# Patient Record
Sex: Female | Born: 1995 | Race: White | Hispanic: No | Marital: Single | Smoking: Never smoker
Health system: Southern US, Community
[De-identification: ages and names within clinical notes are randomized; demographics above are authoritative.]

## PROBLEM LIST (undated history)

## (undated) HISTORY — PX: TYMPANOSTOMY TUBE PLACEMENT: SHX32

---
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
## 2017-08-16 ENCOUNTER — Emergency Department
Admission: EM | Admit: 2017-08-16 | Discharge: 2017-08-16 | Disposition: A | Discharge status: Home / Self Care | Payer: Self-pay | Attending: Emergency Medicine | Admitting: Emergency Medicine

## 2017-08-16 ENCOUNTER — Other Ambulatory Visit: Payer: Self-pay

## 2017-08-16 ENCOUNTER — Encounter: Payer: Self-pay | Admitting: Emergency Medicine

## 2017-08-16 DIAGNOSIS — J069 Acute upper respiratory infection, unspecified: Secondary | ICD-10-CM | POA: Insufficient documentation

## 2017-08-16 DIAGNOSIS — B9789 Other viral agents as the cause of diseases classified elsewhere: Secondary | ICD-10-CM

## 2017-08-16 MED ORDER — GUAIFENESIN-CODEINE 100-10 MG/5ML PO SYRP: 5.0000 mL | ORAL_SOLUTION | Freq: Three times a day (TID) | ORAL | 0 refills | Status: AC | PRN

## 2017-08-16 NOTE — ED Notes (Signed)
Coughing during triage.  Color good, skin warm and dry.  Wearing face mask.

## 2017-08-16 NOTE — ED Triage Notes (Signed)
Patient complaining of cough beginning yesterday noon.  Emesis during the night.  Chest pain with coughing, occasionally productive.  Also complaining of sore throat "down the back".  Wearing face mask.  Accompanied by Mom.

## 2017-08-16 NOTE — ED Notes (Signed)
See triage note  Presents with a 1 day hx of cough..states she is also having some chest discomfort cough  Vomited last pm  Denies any fever  Afebrile on arrival

## 2017-08-17 NOTE — ED Provider Notes (Signed)
[facility] Emergency Department Provider Note  ____________________________________________  Time seen: Approximately 09:16 AM  I have reviewed the triage vital signs and the nursing notes.   HISTORY  Chief Complaint Cough and Emesis   HPI Olivia Bonilla is a 22 y.o. female who presents to the emergency department for treatment and evaluation of URI symptoms that started yesterday. Cough is occasionally productive and causes her chest to hurt. She had a single episode of posttussive emesis last night.  No known fever.  No alleviating measures were attempted for this complaint prior to arrival.  History reviewed. No pertinent past medical history.  There are no active problems to display for this patient.   Prior to Admission medications   Medication Sig Start Date End Date Taking? Authorizing Provider  guaiFENesin-codeine (ROBITUSSIN AC) 100-10 MG/5ML syrup Take 5 mLs by mouth 3 (three) times daily as needed for cough. 08/16/17   Chinita Pesterriplett, Alonzo Loving B, FNP    Allergies Patient has no known allergies.  No family history on file.  Social History Social History   Tobacco Use  . Smoking status: Never Smoker  . Smokeless tobacco: Never Used  Substance Use Topics  . Alcohol use: Never    Frequency: Never  . Drug use: Never    Review of Systems Constitutional: Negative fever/chills ENT: Positive sore throat. Cardiovascular: Denies chest pain. Respiratory: Negative for shortness of breath.  Positive for cough. Gastrointestinal: Positive nausea, single episode of vomiting.  No diarrhea.  Musculoskeletal: Negative for body aches Skin: Negative for rash. Neurological: Positive for headaches ____________________________________________   PHYSICAL EXAM:  VITAL SIGNS: ED Triage Vitals  Enc Vitals Group     BP 08/16/17 0816 (!) 150/70     Pulse Rate 08/16/17 0816 88     Resp 08/16/17 0816 16     Temp 08/16/17 0816 98.3 F (36.8 C)     Temp Source  08/16/17 0816 Oral     SpO2 08/16/17 0816 98 %     Weight 08/16/17 0818 195 lb (88.5 kg)     Height 08/16/17 0818 5\' 7"  (1.702 m)     Head Circumference --      Peak Flow --      Pain Score 08/16/17 0817 6     Pain Loc --      Pain Edu? --      Excl. in GC? --     Constitutional: Alert and oriented.  Acutely ill appearing and in no acute distress. Eyes: Conjunctivae are normal. EOMI. Ears: Bilateral tympanic membranes are mildly injected and erythematous Nose: Sinus congestion noted; clear rhinnorhea. Mouth/Throat: Mucous membranes are moist.  Oropharynx mildly erythematous. Tonsils not visualized. Neck: No stridor.  Lymphatic: No cervical lymphadenopathy. Cardiovascular: Normal rate, regular rhythm. Good peripheral circulation. Respiratory: Normal respiratory effort.  No retractions.  Breath sounds clear to auscultation throughout. Gastrointestinal: Soft and nontender.  Musculoskeletal: FROM x 4 extremities.  Neurologic:  Normal speech and language.  Skin:  Skin is warm, dry and intact. No rash noted. Psychiatric: Mood and affect are normal. Speech and behavior are normal.  ____________________________________________   LABS (all labs ordered are listed, but only abnormal results are displayed)  Labs Reviewed - No data to display ____________________________________________  EKG  Not indicated ____________________________________________  RADIOLOGY  None indicated ____________________________________________   PROCEDURES  Procedure(s) performed: None  Critical Care performed: No ____________________________________________   INITIAL IMPRESSION / ASSESSMENT AND PLAN / ED COURSE  22 y.o. female who presents to the emergency department  for treatment and evaluation of symptoms and exam most consistent with viral upper respiratory infection.  She will be given a prescription for guaifenesin with codeine and advised to follow-up with the primary care provider for  choice for symptoms that are not improving over the next few days.  She was encouraged to take Tylenol or ibuprofen if needed for pain or fever. She was encouraged to return to the ER for symptoms that change or worsen if unable to schedule an appointment.  Medications - No data to display  ED Discharge Orders        Ordered    guaiFENesin-codeine (ROBITUSSIN AC) 100-10 MG/5ML syrup  3 times daily PRN     08/16/17 0859       Pertinent labs & imaging results that were available during my care of the patient were reviewed by me and considered in my medical decision making (see chart for details).    If controlled substance prescribed during this visit, 12 month history viewed on the [facility] prior to issuing an initial prescription for Schedule II or III opiod. ____________________________________________   FINAL CLINICAL IMPRESSION(S) / ED DIAGNOSES  Final diagnoses:  Viral URI with cough    Note:  This document was prepared using Dragon voice recognition software and may include unintentional dictation errors.     Chinita Pester, FNP 08/17/17 1642    Darci Current, MD 08/18/17 [phone]

## 2018-04-07 ENCOUNTER — Other Ambulatory Visit: Payer: Self-pay

## 2018-04-07 DIAGNOSIS — R0602 Shortness of breath: Secondary | ICD-10-CM | POA: Insufficient documentation

## 2018-04-07 DIAGNOSIS — R0789 Other chest pain: Secondary | ICD-10-CM | POA: Diagnosis not present

## 2018-04-07 DIAGNOSIS — R079 Chest pain, unspecified: Secondary | ICD-10-CM | POA: Diagnosis present

## 2018-04-07 NOTE — ED Triage Notes (Signed)
Pt states has had mid chest pain off and on for 4 days. Pt states at times also has right arm pain. Pt states has had shob, but no nausea. Pt appears in no acute distress.

## 2018-04-08 ENCOUNTER — Emergency Department: Payer: Medicaid - Out of State

## 2018-04-08 ENCOUNTER — Emergency Department
Admission: EM | Admit: 2018-04-08 | Discharge: 2018-04-08 | Disposition: A | Discharge status: Home / Self Care | Payer: Medicaid - Out of State | Attending: Emergency Medicine | Admitting: Emergency Medicine

## 2018-04-08 DIAGNOSIS — R0789 Other chest pain: Secondary | ICD-10-CM

## 2018-04-08 LAB — HEPATIC FUNCTION PANEL
ALT: 16 U/L (ref 0–44)
AST: 17 U/L (ref 15–41)
Albumin: 4.3 g/dL (ref 3.5–5.0)
Alkaline Phosphatase: 56 U/L (ref 38–126)
Bilirubin, Direct: <0.1 mg/dL (ref 0.0–0.2)
TOTAL PROTEIN: 7.5 g/dL (ref 6.5–8.1)
Total Bilirubin: 0.7 mg/dL (ref 0.3–1.2)

## 2018-04-08 LAB — CBC
HEMATOCRIT: 39.5 % (ref 36.0–46.0)
HEMOGLOBIN: 13 g/dL (ref 12.0–15.0)
MCH: 30.3 pg (ref 26.0–34.0)
MCHC: 32.9 g/dL (ref 30.0–36.0)
MCV: 92.1 fL (ref 80.0–100.0)
Platelets: 322 10*3/uL (ref 150–400)
RBC: 4.29 MIL/uL (ref 3.87–5.11)
RDW: 12 % (ref 11.5–15.5)
WBC: 10.2 10*3/uL (ref 4.0–10.5)
nRBC: 0 % (ref 0.0–0.2)

## 2018-04-08 LAB — BASIC METABOLIC PANEL
Anion gap: 11 (ref 5–15)
BUN: 19 mg/dL (ref 6–20)
CO2: 23 mmol/L (ref 22–32)
Calcium: 8.8 mg/dL — ABNORMAL LOW (ref 8.9–10.3)
Chloride: 104 mmol/L (ref 98–111)
Creatinine, Ser: 0.75 mg/dL (ref 0.44–1.00)
GFR calc Af Amer: >60 mL/min (ref >60)
GFR calc non Af Amer: >60 mL/min (ref >60)
Glucose, Bld: 91 mg/dL (ref 70–99)
POTASSIUM: 3.8 mmol/L (ref 3.5–5.1)
Sodium: 138 mmol/L (ref 135–145)

## 2018-04-08 LAB — TROPONIN I: Troponin I: <0.03 ng/mL (ref <0.03)

## 2018-04-08 LAB — POCT PREGNANCY, URINE: Preg Test, Ur: NEGATIVE

## 2018-04-08 LAB — FIBRIN DERIVATIVES D-DIMER (ARMC ONLY): Fibrin derivatives D-dimer (ARMC): 236.54 ng/mL (FEU) (ref 0.00–499.00)

## 2018-04-08 LAB — LIPASE, BLOOD: Lipase: 36 U/L (ref 11–51)

## 2018-04-08 MED ORDER — SUCRALFATE 1 G PO TABS
1.0000 g | ORAL_TABLET | Freq: Once | ORAL | Status: AC
  Administered 2018-04-08: 1 g via ORAL
  Filled 2018-04-08: qty 1

## 2018-04-08 MED ORDER — FAMOTIDINE 20 MG PO TABS: 20.0000 mg | ORAL_TABLET | Freq: Two times a day (BID) | ORAL | 0 refills | Status: AC

## 2018-04-08 MED ORDER — ALUM & MAG HYDROXIDE-SIMETH 200-200-20 MG/5ML PO SUSP
30.0000 mL | Freq: Once | ORAL | Status: AC
  Administered 2018-04-08: 30 mL via ORAL
  Filled 2018-04-08: qty 30

## 2018-04-08 MED ORDER — MISOPROSTOL 200 MCG PO TABS
200.0000 ug | ORAL_TABLET | Freq: Once | ORAL | Status: AC
  Administered 2018-04-08: 200 ug via ORAL
  Filled 2018-04-08: qty 1

## 2018-04-08 NOTE — Discharge Instructions (Signed)
Fortunately today your blood work was reassuring.  Please take your antacids as prescribed and follow up with a new PMD within 1 week for a recheck.  Return to the ED sooner for any concerns.  It was a pleasure to take care of you today, and thank you for coming to our emergency department.  If you have any questions or concerns before leaving please ask the nurse to grab me and I'm more than happy to go through your aftercare instructions again.  If you were prescribed any opioid pain medication today such as Norco, Vicodin, Percocet, morphine, hydrocodone, or oxycodone please make sure you do not drive when you are taking this medication as it can alter your ability to drive safely.  If you have any concerns once you are home that you are not improving or are in fact getting worse before you can make it to your follow-up appointment, please do not hesitate to call 911 and come back for further evaluation.  Merrily BrittleNeil Samina Weekes, MD  Results for orders placed or performed during the hospital encounter of 04/08/18  Basic metabolic panel  Result Value Ref Range   Sodium 138 135 - 145 mmol/L   Potassium 3.8 3.5 - 5.1 mmol/L   Chloride 104 98 - 111 mmol/L   CO2 23 22 - 32 mmol/L   Glucose, Bld 91 70 - 99 mg/dL   BUN 19 6 - 20 mg/dL   Creatinine, Ser 5.280.75 0.44 - 1.00 mg/dL   Calcium 8.8 (L) 8.9 - 10.3 mg/dL   GFR calc non Af Amer >60 >60 mL/min   GFR calc Af Amer >60 >60 mL/min   Anion gap 11 5 - 15  CBC  Result Value Ref Range   WBC 10.2 4.0 - 10.5 K/uL   RBC 4.29 3.87 - 5.11 MIL/uL   Hemoglobin 13.0 12.0 - 15.0 g/dL   HCT 41.339.5 24.436.0 - 01.046.0 %   MCV 92.1 80.0 - 100.0 fL   MCH 30.3 26.0 - 34.0 pg   MCHC 32.9 30.0 - 36.0 g/dL   RDW 27.212.0 53.611.5 - 64.415.5 %   Platelets 322 150 - 400 K/uL   nRBC 0.0 0.0 - 0.2 %  Troponin I - ONCE - STAT  Result Value Ref Range   Troponin I <0.03 <0.03 ng/mL  Fibrin derivatives D-Dimer  Result Value Ref Range   Fibrin derivatives D-dimer ([facility]) 236.54 0.00 -  499.00 ng/mL (FEU)  Pregnancy, urine POC  Result Value Ref Range   Preg Test, Ur NEGATIVE NEGATIVE   Dg Chest 2 View  Result Date: 04/08/2018 CLINICAL DATA:  22 year old female with chest pain. EXAM: CHEST - 2 VIEW COMPARISON:  None. FINDINGS: The heart size and mediastinal contours are within normal limits. Both lungs are clear. The visualized skeletal structures are unremarkable. IMPRESSION: No active cardiopulmonary disease. Electronically Signed   By: Elgie CollardArash  Radparvar M.D.   On: 04/08/2018 00:29

## 2018-04-08 NOTE — ED Provider Notes (Signed)
[facility] Emergency Department Provider Note  ____________________________________________   First MD Initiated Contact with Patient 04/08/18 [phone]     (approximate)  I have reviewed the triage vital signs and the nursing notes.   HISTORY  Chief Complaint Chest Pain   HPI Olivia Bonilla is a 22 y.o. female who self presents to the emergency department with 4 days of atypical chest pain.  The pain is in her left lateral chest described as aching and uncomfortable.  She is had intermittent shortness of breath.  No nausea.  She is had no recent upper respiratory tract symptoms.  The pain seems to be somewhat worse when lying flat and somewhat improved with sitting up.   She has no history of abdominal surgeries.  No recent surgery travel or immobilization.  She does take birth control. no history of DVT or pulmonary embolism.  No sick contacts.  Symptoms been gradual onset are now constant and nothing seems to make them better or worse.   No past medical history on file.  There are no active problems to display for this patient.     Prior to Admission medications   Medication Sig Start Date End Date Taking? Authorizing Provider  famotidine (PEPCID) 20 MG tablet Take 1 tablet (20 mg total) by mouth 2 (two) times daily. 04/08/18 04/08/19  Merrily Brittleifenbark, Offie Waide, MD  guaiFENesin-codeine (ROBITUSSIN AC) 100-10 MG/5ML syrup Take 5 mLs by mouth 3 (three) times daily as needed for cough. 08/16/17   Chinita Pesterriplett, Cari B, FNP    Allergies Patient has no known allergies.  No family history on file.  Social History Social History   Tobacco Use  . Smoking status: Never Smoker  . Smokeless tobacco: Never Used  Substance Use Topics  . Alcohol use: Never    Frequency: Never  . Drug use: Never    Review of Systems Constitutional: No fever/chills Eyes: No visual changes. ENT: No sore throat. Cardiovascular: Positive for chest pain. Respiratory: Positive for shortness of  breath. Gastrointestinal: No abdominal pain.  Positive for nausea, no vomiting.  No diarrhea.  No constipation. Genitourinary: Negative for dysuria. Musculoskeletal: Negative for back pain. Skin: Negative for rash. Neurological: Negative for headaches, focal weakness or numbness.   ____________________________________________   PHYSICAL EXAM:  VITAL SIGNS: ED Triage Vitals  Enc Vitals Group     BP 04/07/18 2351 126/68     Pulse Rate 04/07/18 2351 71     Resp 04/07/18 2351 20     Temp 04/07/18 2351 98 F (36.7 C)     Temp Source 04/07/18 2351 Oral     SpO2 04/07/18 2351 100 %     Weight 04/07/18 2352 198 lb (89.8 kg)     Height 04/07/18 2352 5\' 7"  (1.702 m)     Head Circumference --      Peak Flow --      Pain Score 04/07/18 2356 6     Pain Loc --      Pain Edu? --      Excl. in GC? --     Constitutional: Alert and oriented x4 somewhat anxious appearing although nontoxic no diaphoresis speaks in full clear sentences Eyes: PERRL EOMI. Head: Atraumatic. Nose: No congestion/rhinnorhea. Mouth/Throat: No trismus Neck: No stridor.   Cardiovascular: Normal rate, regular rhythm. Grossly normal heart sounds.  Good peripheral circulation. Respiratory: Normal respiratory effort.  No retractions. Lungs CTAB and moving good air Gastrointestinal: Soft with mild epigastric tenderness although no rebound or guarding no peritonitis  Musculoskeletal: No lower extremity edema legs are equal in size Neurologic:  Normal speech and language. No gross focal neurologic deficits are appreciated. Skin:  Skin is warm, dry and intact. No rash noted. Psychiatric: Mood and affect are normal. Speech and behavior are normal.    ____________________________________________   DIFFERENTIAL includes but not limited to  Pulmonary embolism, acute coronary syndrome, myocarditis, pericarditis, ectopic pregnancy, biliary colic, pancreatitis, gastritis ____________________________________________    LABS (all labs ordered are listed, but only abnormal results are displayed)  Labs Reviewed  BASIC METABOLIC PANEL - Abnormal; Notable for the following components:      Result Value   Calcium 8.8 (*)    All other components within normal limits  CBC  TROPONIN I  FIBRIN DERIVATIVES D-DIMER ([facility] ONLY)  LIPASE, BLOOD  HEPATIC FUNCTION PANEL  POCT PREGNANCY, URINE    Lab work reviewed by me with no signs of acute ischemia and d-dimer is negative __________________________________________  EKG  ED ECG REPORT I, Merrily Brittle, the attending physician, personally viewed and interpreted this ECG.  Date: 04/09/2018 EKG Time:  Rate: 66 Rhythm: normal sinus rhythm QRS Axis: normal Intervals: normal ST/T Wave abnormalities: normal Narrative Interpretation: no evidence of acute ischemia  ____________________________________________  RADIOLOGY  X-ray reviewed by me with no acute disease ____________________________________________   PROCEDURES  Procedure(s) performed: no  Procedures  Critical Care performed: no  ____________________________________________   INITIAL IMPRESSION / ASSESSMENT AND PLAN / ED COURSE  Pertinent labs & imaging results that were available during my care of the patient were reviewed by me and considered in my medical decision making (see chart for details).   As part of my medical decision making, I reviewed the following data within the electronic MEDICAL RECORD NUMBER History obtained from family if available, nursing notes, old chart and ekg, as well as notes from prior ED visits.  The patient comes to the emergency department 4 days of atypical chest pain that is positional and mild shortness of breath.  EKG is nonischemic.  Do not suspect pericarditis or myocarditis.  D-dimer is negative.  No evidence of biliary disease on lab work or physical exam.  Given GI cocktail with some improvement in her symptoms.  I will prescribe her Pepcid twice a  day for the next month and refer her back to primary care.  Strict return precautions have been given.      ____________________________________________   FINAL CLINICAL IMPRESSION(S) / ED DIAGNOSES  Final diagnoses:  Atypical chest pain      NEW MEDICATIONS STARTED DURING THIS VISIT:  Discharge Medication List as of 04/08/2018  4:49 AM    START taking these medications   Details  famotidine (PEPCID) 20 MG tablet Take 1 tablet (20 mg total) by mouth 2 (two) times daily., Starting Sun 04/08/2018, Until Mon 04/08/2019, Print         Note:  This document was prepared using Dragon voice recognition software and may include unintentional dictation errors.     Merrily Brittle, MD 04/09/18 1044

## 2018-04-08 NOTE — ED Notes (Signed)
Reports mid to left chest pain off/on for 3-4 days.  Reports occasional with shortness of breath and right arm "feels funny".

## 2018-07-21 ENCOUNTER — Other Ambulatory Visit: Payer: Self-pay

## 2018-07-21 ENCOUNTER — Emergency Department: Payer: Medicaid - Out of State

## 2018-07-21 ENCOUNTER — Emergency Department
Admission: EM | Admit: 2018-07-21 | Discharge: 2018-07-21 | Disposition: A | Discharge status: Home / Self Care | Payer: Medicaid - Out of State | Attending: Emergency Medicine | Admitting: Emergency Medicine

## 2018-07-21 DIAGNOSIS — R05 Cough: Secondary | ICD-10-CM | POA: Diagnosis present

## 2018-07-21 DIAGNOSIS — J069 Acute upper respiratory infection, unspecified: Secondary | ICD-10-CM | POA: Diagnosis not present

## 2018-07-21 LAB — BASIC METABOLIC PANEL
Anion gap: 7 (ref 5–15)
BUN: 20 mg/dL (ref 6–20)
CO2: 24 mmol/L (ref 22–32)
Calcium: 8.9 mg/dL (ref 8.9–10.3)
Chloride: 108 mmol/L (ref 98–111)
Creatinine, Ser: 0.59 mg/dL (ref 0.44–1.00)
GFR calc Af Amer: >60 mL/min (ref >60)
GFR calc non Af Amer: >60 mL/min (ref >60)
Glucose, Bld: 90 mg/dL (ref 70–99)
POTASSIUM: 3.7 mmol/L (ref 3.5–5.1)
Sodium: 139 mmol/L (ref 135–145)

## 2018-07-21 LAB — CBC
HCT: 36.4 % (ref 36.0–46.0)
HEMOGLOBIN: 12.3 g/dL (ref 12.0–15.0)
MCH: 30.2 pg (ref 26.0–34.0)
MCHC: 33.8 g/dL (ref 30.0–36.0)
MCV: 89.4 fL (ref 80.0–100.0)
Platelets: 254 10*3/uL (ref 150–400)
RBC: 4.07 MIL/uL (ref 3.87–5.11)
RDW: 12 % (ref 11.5–15.5)
WBC: 7.2 10*3/uL (ref 4.0–10.5)
nRBC: 0 % (ref 0.0–0.2)

## 2018-07-21 LAB — INFLUENZA PANEL BY PCR (TYPE A & B)
Influenza A By PCR: NEGATIVE
Influenza B By PCR: NEGATIVE

## 2018-07-21 MED ORDER — SODIUM CHLORIDE 0.9% FLUSH: 3.0000 mL | Freq: Once | INTRAVENOUS | Status: DC

## 2018-07-21 NOTE — ED Notes (Signed)
Pt was given CDC information and verbalized understanding the need for self-quaratining for the next 2 weeks. Pt come back if having any abnormal symptoms.

## 2018-07-21 NOTE — ED Provider Notes (Signed)
[facility] Emergency Department Provider Note ____________________________________________   First MD Initiated Contact with Patient 07/21/18 2111     (approximate)  I have reviewed the triage vital signs and the nursing notes.  HISTORY  Chief Complaint Cough and Shortness of Breath  HPI Olivia Bonilla is a 23 y.o. female here for evaluation for cough and runny nose  Patient is very upfront, reports she works at [employer] she started having about 2 to 3 days of a slight runny nose and a slight cough.  She is concerned about coronavirus in the community.  Denies pregnancy.  Felt like she had some slight chills but no fever.  She feels a slight feeling of shortness of breath when she coughs but denies any feeling of severe shortness of breath.  There is no chest pain.  No nausea or vomiting.  No headache.  She has not traveled anywhere recently except was to [place] a couple months ago before coronavirus had shown up in that area.  She has not had any exposure to anyone who has known coronavirus, but does report she works at [employer]   History reviewed. No pertinent past medical history.  There are no active problems to display for this patient.   Past Surgical History:  Procedure Laterality Date  . TYMPANOSTOMY TUBE PLACEMENT      Prior to Admission medications   Medication Sig Start Date End Date Taking? Authorizing Provider  famotidine (PEPCID) 20 MG tablet Take 1 tablet (20 mg total) by mouth 2 (two) times daily. 04/08/18 04/08/19  Merrily Brittle, MD  guaiFENesin-codeine (ROBITUSSIN AC) 100-10 MG/5ML syrup Take 5 mLs by mouth 3 (three) times daily as needed for cough. 08/16/17   Chinita Pester, FNP    Allergies Patient has no known allergies.  No family history on file.  Social History Social History   Tobacco Use  . Smoking status: Never Smoker  . Smokeless tobacco: Never Used  Substance Use Topics  . Alcohol use: Never    Frequency: Never  .  Drug use: Never    Review of Systems Constitutional: No fever some chills Eyes: No visual changes. ENT: No sore throat.  Slight runny nose Cardiovascular: Denies chest pain. Respiratory: Mild feeling of shortness of breath.  No wheezing.  Slight dry cough nonproductive. Gastrointestinal: No abdominal pain.   Genitourinary: Negative for dysuria. Musculoskeletal: Negative for back pain. Skin: Negative for rash. Neurological: Negative for headaches, areas of focal weakness or numbness.    ____________________________________________   PHYSICAL EXAM:  VITAL SIGNS: ED Triage Vitals  Enc Vitals Group     BP 07/21/18 2043 (!) 143/74     Pulse Rate 07/21/18 2043 80     Resp 07/21/18 2043 18     Temp 07/21/18 2043 98.7 F (37.1 C)     Temp Source 07/21/18 2043 Oral     SpO2 07/21/18 2043 100 %     Weight 07/21/18 2043 204 lb (92.5 kg)     Height 07/21/18 2043 5\' 6"  (1.676 m)     Head Circumference --      Peak Flow --      Pain Score 07/21/18 2045 0     Pain Loc --      Pain Edu? --      Excl. in GC? --     Constitutional: Alert and oriented. Well appearing and in no acute distress. Eyes: Conjunctivae are normal. Head: Atraumatic. Nose: Verline Lema. Mouth/Throat: Mucous membranes are moist. Neck: No  stridor.  Cardiovascular: Normal rate, regular rhythm. Grossly normal heart sounds.  Good peripheral circulation. Respiratory: Normal respiratory effort.  No retractions. Lungs CTAB.  Slight and occasional dry cough.  Speaking in full and clear sentences with normal pulse oximetry on room air. Gastrointestinal: Soft and nontender. No distention. Musculoskeletal: No lower extremity tenderness nor edema. Neurologic:  Normal speech and language. No gross focal neurologic deficits are appreciated.  Skin:  Skin is warm, dry and intact. No rash noted. Psychiatric: Mood and affect are normal. Speech and behavior are normal.  ____________________________________________   LABS (all labs  ordered are listed, but only abnormal results are displayed)  Labs Reviewed  BASIC METABOLIC PANEL  CBC  INFLUENZA PANEL BY PCR (TYPE A & B)  POC URINE PREG, ED   ____________________________________________  EKG  ED ECG REPORT I, Sharyn Creamer, the attending physician, personally viewed and interpreted this ECG.  Date: 07/21/2018 EKG Time: 2045 Rate: 70 Rhythm: normal sinus rhythm QRS Axis: normal Intervals: normal ST/T Wave abnormalities: normal Narrative Interpretation: no evidence of acute ischemia  ____________________________________________  RADIOLOGY  Chest x-ray normal ____________________________________________   PROCEDURES  Procedure(s) performed: None  Procedures  Critical Care performed: No  ____________________________________________   INITIAL IMPRESSION / ASSESSMENT AND PLAN / ED COURSE  Pertinent labs & imaging results that were available during my care of the patient were reviewed by me and considered in my medical decision making (see chart for details).   Patient presents for upper respiratory symptoms for about 2 to 3 days.  Some slight shortness of breath.  Very reassuring clinical examination nontoxic well-appearing with normal vital signs and normal work of breathing.  She does not appear toxic.  EKG chest x-ray and lab work reassuring.  Unclear if this could represent coronavirus, my suspicion is relatively low risk but she does have exposure through work at [employer] with lots of people in the community where coronavirus is now apparent.  Flu test negative.  Discussed with patient, information from [facility] provided.  Self quarantine anticipated with careful return precautions discussed      ____________________________________________   FINAL CLINICAL IMPRESSION(S) / ED DIAGNOSES  Final diagnoses:  Upper respiratory infection, viral        Note:  This document was prepared using Dragon voice recognition software and may include  unintentional dictation errors       Sharyn Creamer, MD 07/21/18 2309

## 2018-07-21 NOTE — ED Triage Notes (Signed)
Patient c/o cough and SOB X 2 days. Patient denies pain.

## 2018-07-21 NOTE — ED Notes (Addendum)
Pt c/o cough and shortness of breath; chills but has not checked temp; pt talking in complete coherent sentences; ambulatory with steady gait; pt denies travel;

## 2018-09-21 ENCOUNTER — Encounter: Payer: Self-pay | Admitting: Emergency Medicine

## 2018-09-21 ENCOUNTER — Emergency Department
Admission: EM | Admit: 2018-09-21 | Discharge: 2018-09-21 | Disposition: A | Discharge status: Home / Self Care | Payer: Medicaid - Out of State | Attending: Emergency Medicine | Admitting: Emergency Medicine

## 2018-09-21 ENCOUNTER — Other Ambulatory Visit: Payer: Self-pay

## 2018-09-21 DIAGNOSIS — Z03818 Encounter for observation for suspected exposure to other biological agents ruled out: Secondary | ICD-10-CM | POA: Diagnosis not present

## 2018-09-21 DIAGNOSIS — Z0000 Encounter for general adult medical examination without abnormal findings: Secondary | ICD-10-CM

## 2018-09-21 DIAGNOSIS — Z029 Encounter for administrative examinations, unspecified: Secondary | ICD-10-CM | POA: Diagnosis present

## 2018-09-21 NOTE — ED Provider Notes (Signed)
[facility] Emergency Department Provider Note  ____________________________________________  Time seen: Approximately 12:32 PM  I have reviewed the triage vital signs and the nursing notes.   HISTORY  Chief Complaint No chief complaint on file.   HPI Coolidge BreezeCarmen Mates is a 23 y.o. female  with no significant past medical history who presents to the emergency room requesting medical clearance to return to work.  According to patient, her roommate works with another guy who was exposed to somebody who tested positive for COVID.  Patient denies any cough, fever, chest pain, shortness of breath.  Her roommate has also had no symptoms.  Her roommate has not been tested.  PMHJ None  Past Surgical History:  Procedure Laterality Date  . TYMPANOSTOMY TUBE PLACEMENT      Prior to Admission medications   Medication Sig Start Date End Date Taking? Authorizing Provider  famotidine (PEPCID) 20 MG tablet Take 1 tablet (20 mg total) by mouth 2 (two) times daily. 04/08/18 04/08/19  Merrily Brittleifenbark, Neil, MD  guaiFENesin-codeine (ROBITUSSIN AC) 100-10 MG/5ML syrup Take 5 mLs by mouth 3 (three) times daily as needed for cough. 08/16/17   Chinita Pesterriplett, Cari B, FNP    Allergies Patient has no known allergies.  No family history on file.  Social History Social History   Tobacco Use  . Smoking status: Never Smoker  . Smokeless tobacco: Never Used  Substance Use Topics  . Alcohol use: Never    Frequency: Never  . Drug use: Never    Review of Systems  Constitutional: Negative for fever. Eyes: Negative for visual changes. ENT: Negative for sore throat. Neck: No neck pain  Cardiovascular: Negative for chest pain. Respiratory: Negative for shortness of breath. Gastrointestinal: Negative for abdominal pain, vomiting or diarrhea. Genitourinary: Negative for dysuria. Musculoskeletal: Negative for back pain. Skin: Negative for rash. Neurological: Negative for headaches,  weakness or numbness. Psych: No SI or HI  ____________________________________________   PHYSICAL EXAM:  VITAL SIGNS: ED Triage Vitals [09/21/18 1231]  Enc Vitals Group     BP (!) 150/91     Pulse Rate 68     Resp 18     Temp 97.8 F (36.6 C)     Temp Source Oral     SpO2 98 %     Weight 207 lb (93.9 kg)     Height 5\' 6"  (1.676 m)     Head Circumference      Peak Flow      Pain Score      Pain Loc      Pain Edu?      Excl. in GC?     Constitutional: Alert and oriented. Well appearing and in no apparent distress. HEENT:      Head: Normocephalic and atraumatic.         Eyes: Conjunctivae are normal. Sclera is non-icteric.       Mouth/Throat: Mucous membranes are moist.       Neck: Supple with no signs of meningismus. Cardiovascular: Regular rate and rhythm. No murmurs, gallops, or rubs. 2+ symmetrical distal pulses are present in all extremities. No JVD. Respiratory: Normal respiratory effort. Lungs are clear to auscultation bilaterally. No wheezes, crackles, or rhonchi.  Gastrointestinal: Soft, non tender, and non distended with positive bowel sounds. No rebound or guarding. Musculoskeletal: Nontender with normal range of motion in all extremities. No edema, cyanosis, or erythema of extremities. Neurologic: Normal speech and language. Face is symmetric. Moving all extremities. No gross focal neurologic deficits are  appreciated. Skin: Skin is warm, dry and intact. No rash noted. Psychiatric: Mood and affect are normal. Speech and behavior are normal.  ____________________________________________   LABS (all labs ordered are listed, but only abnormal results are displayed)  Labs Reviewed - No data to display ____________________________________________  EKG  none  ____________________________________________  RADIOLOGY  none  ____________________________________________   PROCEDURES  Procedure(s) performed: None Procedures Critical Care performed:  None  ____________________________________________   INITIAL IMPRESSION / ASSESSMENT AND PLAN / ED COURSE   23 y.o. female  with no significant past medical history who presents to the emergency room requesting medical clearance to return to work.  Patient was evaluated in Emergency Department today for the symptoms described in the history of present illness. Patient was evaluated in the context of the global COVID-19 pandemic, which necessitated consideration that the patient might be at risk for infection with the SARS-CoV-2 virus that causes COVID-19. Patient is well appearing with normal work of breathing, normal sats, no tachycardia or tachypnea. Institutional protocols and algorithms that pertain to the evaluation of patients at risk for COVID-19 are in a state of rapid change based on information released by regulatory bodies including the CDC and federal and state organizations. These policies and algorithms were followed during the patient's care in the ED and patient does not meet criteria for testing at this time. Discussed quarantine recommendations with patient in case patient or any of her household members develop any symptoms of COVID       As part of my medical decision making, I reviewed the following data within the electronic MEDICAL RECORD NUMBER Nursing notes reviewed and incorporated, Old chart reviewed, Notes from prior ED visits and [place] Controlled Substance Database    Pertinent labs & imaging results that were available during my care of the patient were reviewed by me and considered in my medical decision making (see chart for details).    ____________________________________________   FINAL CLINICAL IMPRESSION(S) / ED DIAGNOSES  Final diagnoses:  Evaluation by medical service required      NEW MEDICATIONS STARTED DURING THIS VISIT:  ED Discharge Orders    None       Note:  This document was prepared using Dragon voice recognition software and may include  unintentional dictation errors.    Don Perking, [place], MD 09/21/18 [phone]

## 2018-09-21 NOTE — ED Triage Notes (Signed)
Pt lives with someone who was exposed to a person that is being tested for Covid-19.  Pt's employer requested that she get quarantine paperwork, or get tested. Pt is asymptomatic.

## 2018-10-20 ENCOUNTER — Other Ambulatory Visit: Payer: Self-pay

## 2018-10-20 DIAGNOSIS — R1030 Lower abdominal pain, unspecified: Secondary | ICD-10-CM | POA: Insufficient documentation

## 2018-10-20 DIAGNOSIS — N390 Urinary tract infection, site not specified: Secondary | ICD-10-CM | POA: Insufficient documentation

## 2018-10-20 DIAGNOSIS — Z79899 Other long term (current) drug therapy: Secondary | ICD-10-CM | POA: Insufficient documentation

## 2018-10-20 NOTE — ED Triage Notes (Signed)
Patient reports she has had bilateral lower abdominal pain with dysuria for the past 3 days.  Vomited x1.

## 2018-10-21 ENCOUNTER — Emergency Department: Payer: Medicaid - Out of State

## 2018-10-21 ENCOUNTER — Emergency Department
Admission: EM | Admit: 2018-10-21 | Discharge: 2018-10-21 | Disposition: A | Discharge status: Home / Self Care | Payer: Medicaid - Out of State | Attending: Emergency Medicine | Admitting: Emergency Medicine

## 2018-10-21 DIAGNOSIS — R109 Unspecified abdominal pain: Secondary | ICD-10-CM

## 2018-10-21 DIAGNOSIS — N390 Urinary tract infection, site not specified: Secondary | ICD-10-CM

## 2018-10-21 LAB — CBC
HCT: 39.8 % (ref 36.0–46.0)
Hemoglobin: 13.3 g/dL (ref 12.0–15.0)
MCH: 30.4 pg (ref 26.0–34.0)
MCHC: 33.4 g/dL (ref 30.0–36.0)
MCV: 90.9 fL (ref 80.0–100.0)
Platelets: 284 10*3/uL (ref 150–400)
RBC: 4.38 MIL/uL (ref 3.87–5.11)
RDW: 12 % (ref 11.5–15.5)
WBC: 10.2 10*3/uL (ref 4.0–10.5)
nRBC: 0 % (ref 0.0–0.2)

## 2018-10-21 LAB — COMPREHENSIVE METABOLIC PANEL
ALT: 18 U/L (ref 0–44)
AST: 17 U/L (ref 15–41)
Albumin: 4.6 g/dL (ref 3.5–5.0)
Alkaline Phosphatase: 61 U/L (ref 38–126)
Anion gap: 10 (ref 5–15)
BUN: 16 mg/dL (ref 6–20)
CO2: 22 mmol/L (ref 22–32)
Calcium: 9.1 mg/dL (ref 8.9–10.3)
Chloride: 103 mmol/L (ref 98–111)
Creatinine, Ser: 0.77 mg/dL (ref 0.44–1.00)
GFR calc Af Amer: >60 mL/min (ref >60)
GFR calc non Af Amer: >60 mL/min (ref >60)
Glucose, Bld: 94 mg/dL (ref 70–99)
Potassium: 4.1 mmol/L (ref 3.5–5.1)
Sodium: 135 mmol/L (ref 135–145)
Total Bilirubin: 0.9 mg/dL (ref 0.3–1.2)
Total Protein: 8.1 g/dL (ref 6.5–8.1)

## 2018-10-21 LAB — POCT PREGNANCY, URINE: Preg Test, Ur: NEGATIVE

## 2018-10-21 LAB — URINALYSIS, COMPLETE (UACMP) WITH MICROSCOPIC
Bacteria, UA: NONE SEEN
Bilirubin Urine: NEGATIVE
Glucose, UA: NEGATIVE mg/dL
Hgb urine dipstick: NEGATIVE
Ketones, ur: 5 mg/dL — AB
Nitrite: NEGATIVE
Protein, ur: NEGATIVE mg/dL
Specific Gravity, Urine: 1.013 (ref 1.005–1.030)
pH: 5 (ref 5.0–8.0)

## 2018-10-21 LAB — LIPASE, BLOOD: Lipase: 27 U/L (ref 11–51)

## 2018-10-21 MED ORDER — OXYCODONE-ACETAMINOPHEN 5-325 MG PO TABS
2.0000 | ORAL_TABLET | Freq: Once | ORAL | Status: AC
  Administered 2018-10-21: 2 via ORAL
  Filled 2018-10-21: qty 2

## 2018-10-21 MED ORDER — HYDROCODONE-ACETAMINOPHEN 5-325 MG PO TABS: 2.0000 | ORAL_TABLET | Freq: Four times a day (QID) | ORAL | 0 refills | Status: AC | PRN

## 2018-10-21 MED ORDER — ONDANSETRON 4 MG PO TBDP
4.0000 mg | ORAL_TABLET | Freq: Once | ORAL | Status: AC
  Administered 2018-10-21: 4 mg via ORAL
  Filled 2018-10-21: qty 1

## 2018-10-21 MED ORDER — CEPHALEXIN 500 MG PO CAPS: 500.0000 mg | ORAL_CAPSULE | Freq: Four times a day (QID) | ORAL | 0 refills | Status: AC

## 2018-10-21 MED ORDER — CEPHALEXIN 500 MG PO CAPS
500.0000 mg | ORAL_CAPSULE | Freq: Once | ORAL | Status: AC
  Administered 2018-10-21: 500 mg via ORAL
  Filled 2018-10-21: qty 1

## 2018-10-21 NOTE — ED Notes (Signed)
ED Provider at bedside. 

## 2018-10-21 NOTE — Discharge Instructions (Signed)
Your workup today suggests that you have a urinary tract infection (UTI) which has spread to your kidneys.  Please take your antibiotic as prescribed and over-the-counter pain medication (Tylenol or Motrin) as needed, but no more than recommended on the label instructions.  Drink PLENTY of fluids.  Call your regular doctor to schedule the next available appointment to follow up on todays ED visit, or return immediately to the ED if your pain worsens, you have decreased urine production, develop fever, persistent vomiting, or other symptoms that concern you.  Take Norco as prescribed for severe pain. Do not drink alcohol, drive or participate in any other potentially dangerous activities while taking this medication as it may make you sleepy. Do not take this medication with any other sedating medications, either prescription or over-the-counter. If you were prescribed Percocet or Vicodin, do not take these with acetaminophen (Tylenol) as it is already contained within these medications.   This medication is an opiate (or narcotic) pain medication and can be habit forming.  Use it as little as possible to achieve adequate pain control.  Do not use or use it with extreme caution if you have a history of opiate abuse or dependence.  If you are on a pain contract with your primary care doctor or a pain specialist, be sure to let them know you were prescribed this medication today from the [facility] Emergency Department.  This medication is intended for your use only - do not give any to anyone else and keep it in a secure place where nobody else, especially children, have access to it.  It will also cause or worsen constipation, so you may want to consider taking an over-the-counter stool softener while you are taking this medication.

## 2018-10-21 NOTE — ED Notes (Signed)
Patient observed sitting in lobby, patient requesting pain medication.  Explained unable to give narcotics while waiting, patient refused offer to ask doctor for tylenol or ibuprofen.

## 2018-10-21 NOTE — ED Provider Notes (Signed)
[facility] Emergency Department Provider Note  ____________________________________________   First MD Initiated Contact with Patient 10/21/18 0424     (approximate)  I have reviewed the triage vital signs and the nursing notes.   HISTORY  Chief Complaint Abdominal Pain    HPI Olivia Bonilla is a 23 y.o. female who presents for evaluation of about 3 days of worsening dysuria and bilateral lower back pain.  She believes she may have a urinary tract infection.  Nothing particular makes his symptoms better or worse and is gradually getting worse over time.  She denies fever/chills, cough, sore throat, chest pain, shortness of breath, and abdominal pain except as described above.  She has had one episode of nausea and vomiting.  She has no history of kidney stones and no one in her immediate family has had kidney stones in the past.   She says that every time that she has sexual intercourse she uses a condom without fail and she last had a menstrual period about 2 weeks ago.  She has no vaginal complaints or concerns at this time.  She describes the pain as severe and sometimes she has a difficult time finding position of comfort all the right now the pain is little bit better.        No past medical history on file.  There are no active problems to display for this patient.   Past Surgical History:  Procedure Laterality Date  . TYMPANOSTOMY TUBE PLACEMENT      Prior to Admission medications   Medication Sig Start Date End Date Taking? Authorizing Provider  cephALEXin (KEFLEX) 500 MG capsule Take 1 capsule (500 mg total) by mouth 4 (four) times daily for 12 days. 10/21/18 11/02/18  Loleta RoseForbach, Adalei Novell, MD  famotidine (PEPCID) 20 MG tablet Take 1 tablet (20 mg total) by mouth 2 (two) times daily. 04/08/18 04/08/19  Merrily Brittleifenbark, Neil, MD  guaiFENesin-codeine (ROBITUSSIN AC) 100-10 MG/5ML syrup Take 5 mLs by mouth 3 (three) times daily as needed for cough. 08/16/17    Triplett, Rulon Eisenmengerari B, FNP  HYDROcodone-acetaminophen (NORCO/VICODIN) 5-325 MG tablet Take 2 tablets by mouth every 6 (six) hours as needed for moderate pain or severe pain. 10/21/18   Loleta RoseForbach, Yaret Hush, MD    Allergies Patient has no known allergies.  No family history on file.  Social History Social History   Tobacco Use  . Smoking status: Never Smoker  . Smokeless tobacco: Never Used  Substance Use Topics  . Alcohol use: Never    Frequency: Never  . Drug use: Never    Review of Systems Constitutional: No fever/chills Eyes: No visual changes. ENT: No sore throat. Cardiovascular: Denies chest pain. Respiratory: Denies shortness of breath. Gastrointestinal: No abdominal pain.  One episode of nausea and vomiting.  No diarrhea.  No constipation. Genitourinary: Dysuria, LMP 2 weeks ago Musculoskeletal: Bilateral low back pain. Integumentary: Negative for rash. Neurological: Negative for headaches, focal weakness or numbness.   ____________________________________________   PHYSICAL EXAM:  VITAL SIGNS: ED Triage Vitals  Enc Vitals Group     BP 10/20/18 2355 132/67     Pulse Rate 10/20/18 2355 77     Resp 10/20/18 2355 (!) 24     Temp 10/20/18 2355 98.7 F (37.1 C)     Temp Source 10/20/18 2355 Oral     SpO2 10/20/18 2355 100 %     Weight 10/20/18 2351 93.9 kg (207 lb)     Height 10/20/18 2351 1.702 m (5\' 7" )  Head Circumference --      Peak Flow --      Pain Score 10/20/18 2351 8     Pain Loc --      Pain Edu? --      Excl. in GC? --     Constitutional: Alert and oriented.  Generally well-appearing but does appear uncomfortable. Eyes: Conjunctivae are normal.  Head: Atraumatic. Nose: No congestion/rhinnorhea. Mouth/Throat: Mucous membranes are moist. Neck: No stridor.  No meningeal signs.   Cardiovascular: Normal rate, regular rhythm. Good peripheral circulation. Grossly normal heart sounds. Respiratory: Normal respiratory effort.  No retractions. No audible  wheezing. Gastrointestinal: Soft and nontender. No distention.  Genitourinary: Deferred Musculoskeletal: Bilateral CVA tenderness to percussion.  Neurologic:  Normal speech and language. No gross focal neurologic deficits are appreciated.  Skin:  Skin is warm, dry and intact. No rash noted. Psychiatric: Mood and affect are normal. Speech and behavior are normal.  ____________________________________________   LABS (all labs ordered are listed, but only abnormal results are displayed)  Labs Reviewed  URINALYSIS, COMPLETE (UACMP) WITH MICROSCOPIC - Abnormal; Notable for the following components:      Result Value   Color, Urine YELLOW (*)    APPearance CLEAR (*)    Ketones, ur 5 (*)    Leukocytes,Ua TRACE (*)    All other components within normal limits  URINE CULTURE  LIPASE, BLOOD  COMPREHENSIVE METABOLIC PANEL  CBC  POC URINE PREG, ED  POCT PREGNANCY, URINE   ____________________________________________  EKG  None - EKG not ordered by ED physician ____________________________________________  RADIOLOGY   ED MD interpretation:  No acute intraabdominal abnormality  Official radiology report(s): Ct Renal Stone Study  Result Date: 10/21/2018 CLINICAL DATA:  23 y/o F; bilateral lower abdominal pain with dysuria for the past 3 days. Vomiting x1. EXAM: CT ABDOMEN AND PELVIS WITHOUT CONTRAST TECHNIQUE: Multidetector CT imaging of the abdomen and pelvis was performed following the standard protocol without IV contrast. COMPARISON:  None. FINDINGS: Lower chest: No acute abnormality. Hepatobiliary: No focal liver abnormality is seen. No gallstones, gallbladder wall thickening, or biliary dilatation. Pancreas: Unremarkable. No pancreatic ductal dilatation or surrounding inflammatory changes. Spleen: Normal in size without focal abnormality. Adrenals/Urinary Tract: Adrenal glands are unremarkable. Kidneys are normal, without renal calculi, focal lesion, or hydronephrosis. Bladder is  unremarkable. Stomach/Bowel: Stomach is within normal limits. Appendix appears normal. No evidence of bowel wall thickening, distention, or inflammatory changes. Vascular/Lymphatic: No significant vascular findings are present. No enlarged abdominal or pelvic lymph nodes. Reproductive: Uterus and bilateral adnexa are unremarkable. Other: No abdominal wall hernia or abnormality. No abdominopelvic ascites. Musculoskeletal: No acute or significant osseous findings. IMPRESSION: No acute process identified.  Unremarkable CT of abdomen and pelvis. Electronically Signed   By: Mitzi HansenLance  Furusawa-Stratton M.D.   On: 10/21/2018 05:17    ____________________________________________   PROCEDURES   Procedure(s) performed (including Critical Care):  Procedures   ____________________________________________   INITIAL IMPRESSION / MDM / ASSESSMENT AND PLAN / ED COURSE  As part of my medical decision making, I reviewed the following data within the electronic MEDICAL RECORD NUMBER Nursing notes reviewed and incorporated, Labs reviewed , Old chart reviewed, Notes from prior ED visits and [place] Controlled Substance Database      *Olivia BreezeCarmen Draughon was evaluated in Emergency Department on 10/21/2018 for the symptoms described in the history of present illness. She was evaluated in the context of the global COVID-19 pandemic, which necessitated consideration that the patient might be at risk for infection with  the SARS-CoV-2 virus that causes COVID-19. Institutional protocols and algorithms that pertain to the evaluation of patients at risk for COVID-19 are in a state of rapid change based on information released by regulatory bodies including the CDC and federal and state organizations. These policies and algorithms were followed during the patient's care in the ED.  Some ED evaluations and interventions may be delayed as a result of limited staffing during the pandemic.*  Differential diagnosis includes, but is not limited to,  UTI/pyelonephritis, ureteral stone, musculoskeletal pain/strain, STD/PID.  The patient is adamant that she always uses condoms when she has sexual intercourse and there is no reason to suspect STD/PID is a primary issue right now.  She is having dysuria and has a mildly infected appearing urinalysis.  However she is in a lot of pain and appears acutely uncomfortable with tenderness to percussion when testing for CVA tenderness.  I explained why I do not think we are going to find any abnormalities on CT scan but I did allow for the fact she could have a kidney stone or ureteral stone which would be important to know if she has urinary tract infection.  After discussing the risks and benefits I gave her the choice and she would prefer to have the imaging tonight.  I have ordered a renal stone protocol CT scan of the abdomen and pelvis.  I am treating empirically with Keflex 500 mg by mouth as well as 2 Percocet and Zofran.  Unless there is an emergent finding on the CT scan she should be able to be discharged for close outpatient follow-up.  She agrees with the plan.  Clinical Course as of Oct 21 535  Sun Oct 21, 2018  0534 No acute abnormalities identified on CT renal stone study.  I will continue with the plan to discharge the patient for empiric treatment of UTI as planned.  Urine culture is pending, verified with the lab.  I gave my usual customary return precautions.  CT Renal Laren Everts [CF]    Clinical Course User Index [CF] Hinda Kehr, MD     ____________________________________________  FINAL CLINICAL IMPRESSION(S) / ED DIAGNOSES  Final diagnoses:  Urinary tract infection without hematuria, site unspecified  Bilateral flank pain     MEDICATIONS GIVEN DURING THIS VISIT:  Medications  oxyCODONE-acetaminophen (PERCOCET/ROXICET) 5-325 MG per tablet 2 tablet (2 tablets Oral Given 10/21/18 0443)  cephALEXin (KEFLEX) capsule 500 mg (500 mg Oral Given 10/21/18 0443)  ondansetron  (ZOFRAN-ODT) disintegrating tablet 4 mg (4 mg Oral Given 10/21/18 0444)     ED Discharge Orders         Ordered    cephALEXin (KEFLEX) 500 MG capsule  4 times daily     10/21/18 0535    HYDROcodone-acetaminophen (NORCO/VICODIN) 5-325 MG tablet  Every 6 hours PRN     10/21/18 0535           Note:  This document was prepared using Dragon voice recognition software and may include unintentional dictation errors.   Hinda Kehr, MD 10/21/18 [phone]

## 2018-10-21 NOTE — ED Notes (Signed)
Patient transported to CT 

## 2018-10-22 LAB — URINE CULTURE
Culture: <10000 — AB
Special Requests: NORMAL

## 2018-11-19 ENCOUNTER — Other Ambulatory Visit: Payer: Self-pay

## 2018-11-19 DIAGNOSIS — Z20822 Contact with and (suspected) exposure to covid-19: Secondary | ICD-10-CM

## 2018-11-22 LAB — NOVEL CORONAVIRUS, NAA: SARS-CoV-2, NAA: NOT DETECTED

## 2019-04-07 ENCOUNTER — Other Ambulatory Visit: Payer: Self-pay

## 2019-04-07 ENCOUNTER — Encounter: Payer: Self-pay | Admitting: Emergency Medicine

## 2019-04-07 ENCOUNTER — Emergency Department
Admission: EM | Admit: 2019-04-07 | Discharge: 2019-04-07 | Disposition: A | Discharge status: Home / Self Care | Payer: Self-pay | Attending: Emergency Medicine | Admitting: Emergency Medicine

## 2019-04-07 DIAGNOSIS — U071 COVID-19: Secondary | ICD-10-CM | POA: Insufficient documentation

## 2019-04-07 DIAGNOSIS — Z20822 Contact with and (suspected) exposure to covid-19: Secondary | ICD-10-CM

## 2019-04-07 NOTE — ED Notes (Signed)
Pt verbalized understanding of discharge instructions. NAD at this time. 

## 2019-04-07 NOTE — ED Triage Notes (Addendum)
Pt reports nasal congestion, sneezing and loss of taste and smell that started 4 days ago.  Pt reports she is concerned she is having sxs of COVID.  Pt has had positive COVID contacts. Pt is [redacted] weeks pregnant.

## 2019-04-07 NOTE — Discharge Instructions (Signed)
Your Covid test is pending.  Please call your OB tomorrow to discuss a follow-up appointment.  Please return to the emergency department for any worsening of symptoms.  You can take Tylenol if you develop a fever.

## 2019-04-07 NOTE — ED Notes (Signed)
First Nurse Note: Pt to ED for COVID symptoms. Pt states that she has had positive exposure. Pt is in NAD.

## 2019-04-07 NOTE — ED Notes (Signed)
FHR 147 bpm

## 2019-04-07 NOTE — ED Provider Notes (Signed)
[facility] Emergency Department Provider Note  ____________________________________________  Time seen: Approximately 1:02 PM  I have reviewed the triage vital signs and the nursing notes.   HISTORY  Chief Complaint Nasal Congestion and COVID Sxs    HPI Olivia Bonilla is a 23 y.o. female that presents to the emergency department for evaluation of nasal congestion, loss of taste and smell for 4 days.  Patient was exposed to Covid about 11 days ago.  Patient is [redacted] weeks pregnant and sees [facility] for her OB care.  She has had no complications with the pregnancy.  She denies any abdominal pain or vaginal bleeding.  No fever, cough, shortness of breath, chest pain.   No past medical history on file.  There are no active problems to display for this patient.   Past Surgical History:  Procedure Laterality Date  . TYMPANOSTOMY TUBE PLACEMENT      Prior to Admission medications   Medication Sig Start Date End Date Taking? Authorizing Provider  famotidine (PEPCID) 20 MG tablet Take 1 tablet (20 mg total) by mouth 2 (two) times daily. 04/08/18 04/08/19  Darel Hong, MD  guaiFENesin-codeine (ROBITUSSIN AC) 100-10 MG/5ML syrup Take 5 mLs by mouth 3 (three) times daily as needed for cough. 08/16/17   Triplett, Johnette Abraham B, FNP  HYDROcodone-acetaminophen (NORCO/VICODIN) 5-325 MG tablet Take 2 tablets by mouth every 6 (six) hours as needed for moderate pain or severe pain. 10/21/18   Hinda Kehr, MD    Allergies Patient has no known allergies.  No family history on file.  Social History Social History   Tobacco Use  . Smoking status: Never Smoker  . Smokeless tobacco: Never Used  Substance Use Topics  . Alcohol use: Never    Frequency: Never  . Drug use: Never     Review of Systems  Constitutional: No fever/chills Eyes: No visual changes. No discharge. ENT: Positive for congestion and rhinorrhea. Positive for loss of taste and  smell. Cardiovascular: No chest pain. Respiratory: Negative for cough. No SOB. Gastrointestinal: No abdominal pain.  No nausea, no vomiting.  No diarrhea.  Musculoskeletal: Negative for musculoskeletal pain. Skin: Negative for rash, abrasions, lacerations, ecchymosis. Neurological: Negative for headaches.   ____________________________________________   PHYSICAL EXAM:  VITAL SIGNS: ED Triage Vitals  Enc Vitals Group     BP 04/07/19 1225 135/64     Pulse Rate 04/07/19 1225 72     Resp 04/07/19 1225 18     Temp 04/07/19 1225 98.7 F (37.1 C)     Temp Source 04/07/19 1225 Oral     SpO2 04/07/19 1225 100 %     Weight 04/07/19 1220 207 lb (93.9 kg)     Height 04/07/19 1220 5\' 6"  (1.676 m)     Head Circumference --      Peak Flow --      Pain Score --      Pain Loc --      Pain Edu? --      Excl. in [place]? --      Constitutional: Alert and oriented. Well appearing and in no acute distress. Eyes: Conjunctivae are normal. PERRL. EOMI. No discharge. Head: Atraumatic. ENT: No frontal and maxillary sinus tenderness.      Ears: Tympanic membranes pearly gray with good landmarks. No discharge.      Nose: Mild congestion/rhinnorhea.      Mouth/Throat: Mucous membranes are moist. Oropharynx non-erythematous. Tonsils not enlarged. No exudates. Uvula midline. Neck: No stridor.  Hematological/Lymphatic/Immunilogical: No cervical lymphadenopathy. Cardiovascular: Normal rate, regular rhythm.  Good peripheral circulation. Respiratory: Normal respiratory effort without tachypnea or retractions. Lungs CTAB. Good air entry to the bases with no decreased or absent breath sounds. Gastrointestinal: Bowel sounds 4 quadrants. Soft and nontender to palpation. No guarding or rigidity. No palpable masses. No distention. Musculoskeletal: Full range of motion to all extremities. No gross deformities appreciated. Neurologic:  Normal speech and language. No gross focal neurologic deficits are appreciated.   Skin:  Skin is warm, dry and intact. No rash noted. Psychiatric: Mood and affect are normal. Speech and behavior are normal. Patient exhibits appropriate insight and judgement.   ____________________________________________   LABS (all labs ordered are listed, but only abnormal results are displayed)  Labs Reviewed  SARS CORONAVIRUS 2 (TAT 6-24 HRS)   ____________________________________________  EKG   ____________________________________________  RADIOLOGY   No results found.  ____________________________________________    PROCEDURES  Procedure(s) performed:    Procedures    Medications - No data to display   ____________________________________________   INITIAL IMPRESSION / ASSESSMENT AND PLAN / ED COURSE  Pertinent labs & imaging results that were available during my care of the patient were reviewed by me and considered in my medical decision making (see chart for details).  Review of the [place] CSRS was performed in accordance of the [facility] prior to dispensing any controlled drugs.   Patient's diagnosis is consistent with suspected covid infection. Vital signs and exam are reassuring. Covid test pending.  Fetal heart rate is 147 per RN.  Patient denies any pregnany concerns. Patient appears well and is staying well hydrated. Patient feels comfortable going home. Patient will be discharged home with prescriptions for tylenol. Patient is to follow up with ob as needed or otherwise directed. Patient is given ED precautions to return to the ED for any worsening or new symptoms.   Olivia Bonilla was evaluated in Emergency Department on 04/07/2019 for the symptoms described in the history of present illness. She was evaluated in the context of the global COVID-19 pandemic, which necessitated consideration that the patient might be at risk for infection with the SARS-CoV-2 virus that causes COVID-19. Institutional protocols and algorithms that pertain to the evaluation of  patients at risk for COVID-19 are in a state of rapid change based on information released by regulatory bodies including the CDC and federal and state organizations. These policies and algorithms were followed during the patient's care in the ED.  ____________________________________________  FINAL CLINICAL IMPRESSION(S) / ED DIAGNOSES  Final diagnoses:  Suspected COVID-19 virus infection      NEW MEDICATIONS STARTED DURING THIS VISIT:  ED Discharge Orders    None          This chart was dictated using voice recognition software/Dragon. Despite best efforts to proofread, errors can occur which can change the meaning. Any change was purely unintentional.    Enid Derry, PA-C 04/07/19 1454    Arnaldo Natal, MD 04/07/19 [phone]

## 2019-04-08 ENCOUNTER — Telehealth: Payer: Self-pay

## 2019-04-08 LAB — SARS CORONAVIRUS 2 (TAT 6-24 HRS): SARS Coronavirus 2: POSITIVE — AB

## 2019-04-08 NOTE — Telephone Encounter (Signed)
Pt notified of positive COVID-19 test results. Pt verbalized understanding. Pt reports that she has cough and loss of taste and smell .Pt advised to remain in self quarantine until at least 10 days since symptom onset And 3 consecutive days fever free without antipyretics And improvement in respiratory symptoms. Patient advised to utilize over the counter medications to treat symptoms. Pt advised to seek treatment in the ED if respiratory issues/distress develops.Pt advised they should only leave home to seek and medical care and must wear a mask in public. Pt instructed to limit contact with family members or caregivers in the home. Pt advised to practice social distancing and to continue to use good preventative care measures such has frequent hand washing, staying out of crowds and cleaning hard surfaces frequently touched in the home.Pt informed that the health department will likely follow up and may have additional recommendations. Will notify [facility] Department.

## 2019-07-15 IMAGING — CT CT RENAL STONE PROTOCOL
2 of 4 series · 16 of 46 positions shown, 18 images · non-contrast
Comparison: None.

CLINICAL DATA: 22 y/o F; bilateral lower abdominal pain with
dysuria for the past 3 days. Vomiting x1.

EXAM:
CT ABDOMEN AND PELVIS WITHOUT CONTRAST
TECHNIQUE: Multidetector CT imaging of the abdomen and pelvis was performed
following the standard protocol without IV contrast.

[Series 2: stone full standard · axial · 0.65mm/px · z∈[-1030,-590]mm · 13 of 98 slices shown, 15 images]
[im 5/98  soft-tissue]
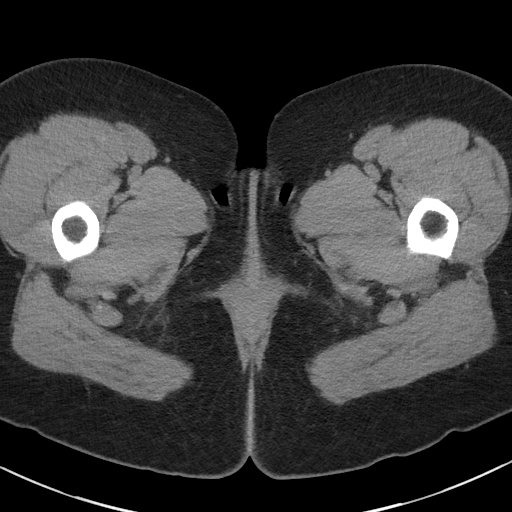
[im 5/98  bone]
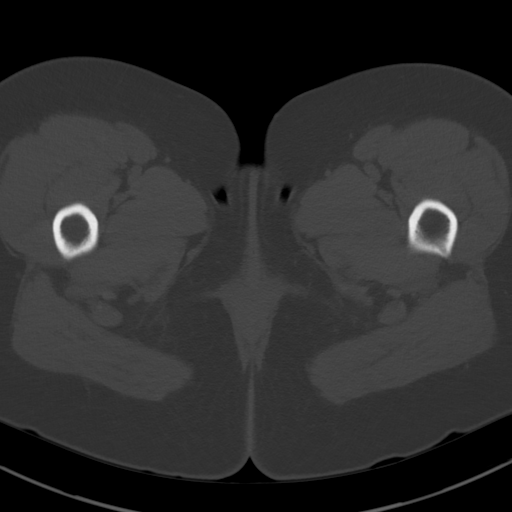
[im 13/98  soft-tissue]
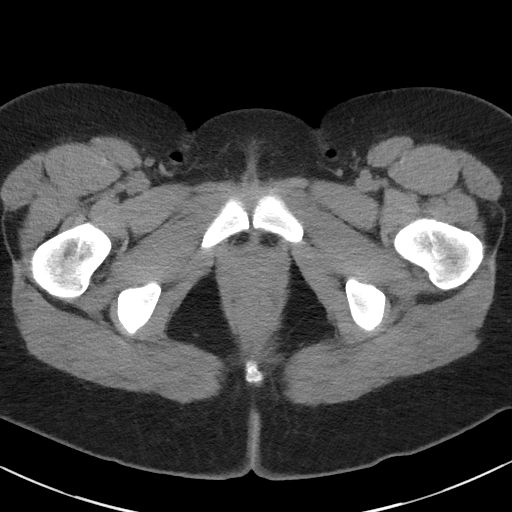
[im 22/98  soft-tissue]
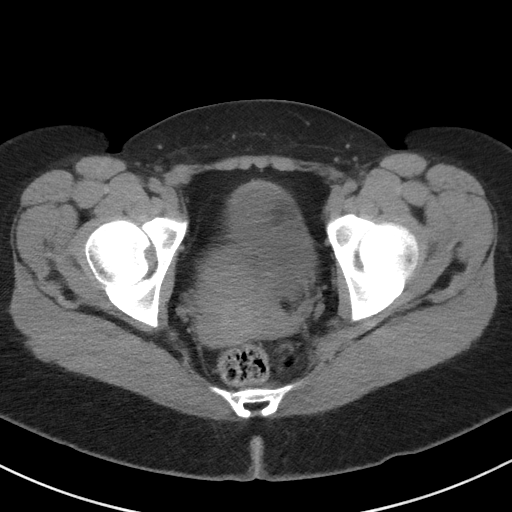
[im 26/98  soft-tissue]
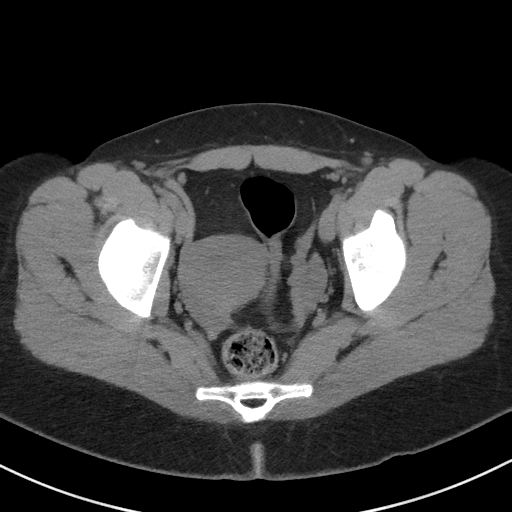
[im 34/98  soft-tissue]
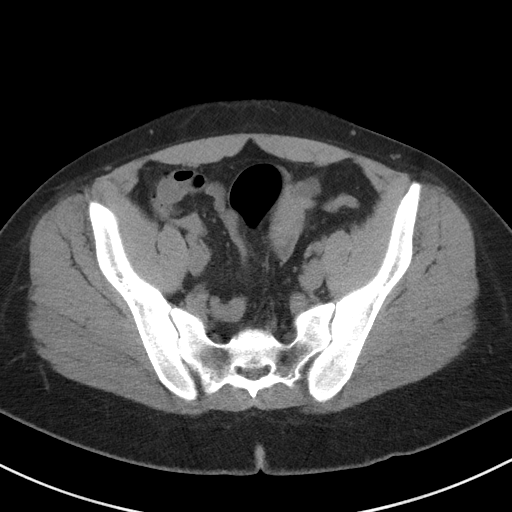
[im 43/98  soft-tissue]
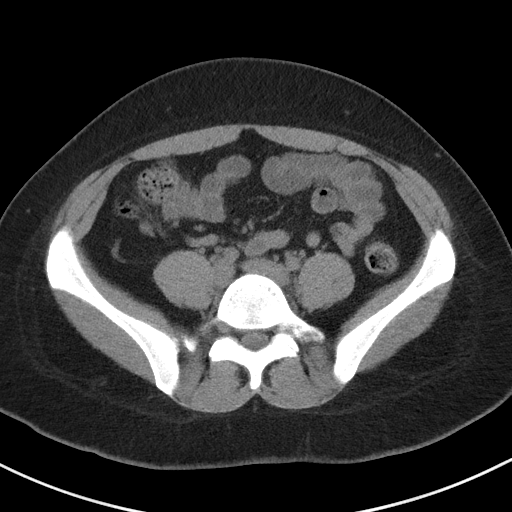
[im 51/98  soft-tissue]
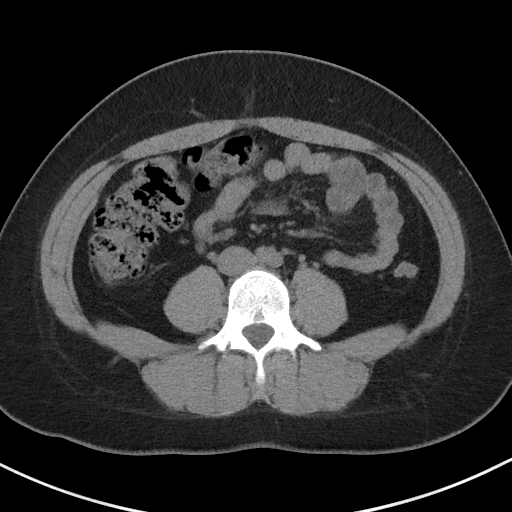
[im 55/98  soft-tissue]
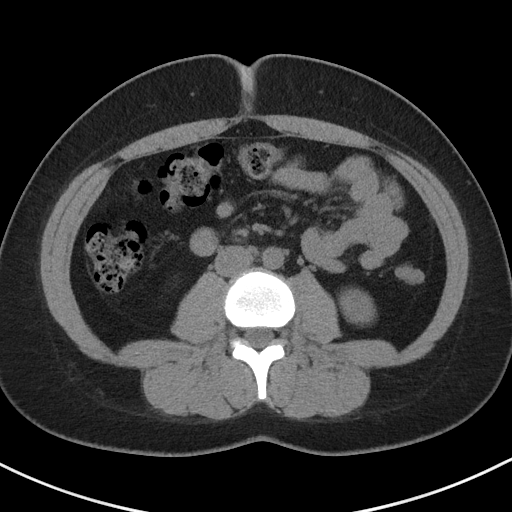
[im 64/98  soft-tissue]
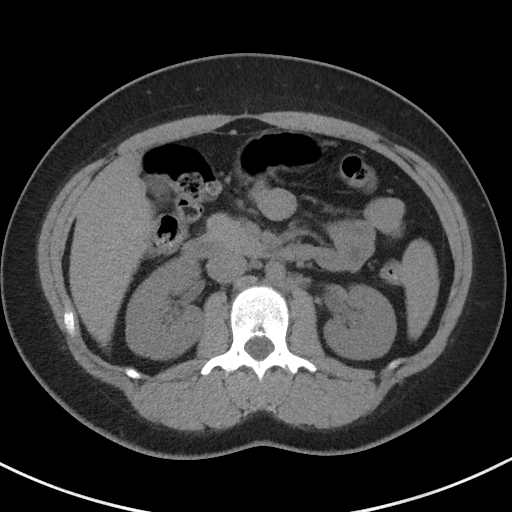
[im 64/98  bone]
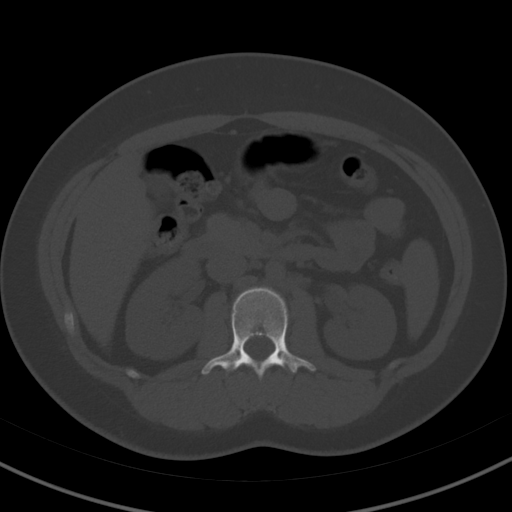
[im 72/98  soft-tissue]
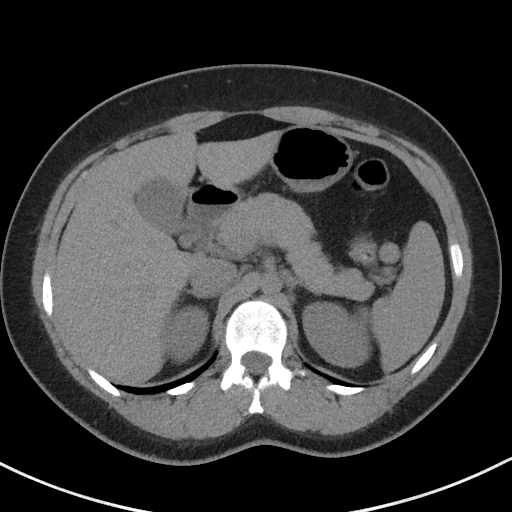
[im 76/98  soft-tissue]
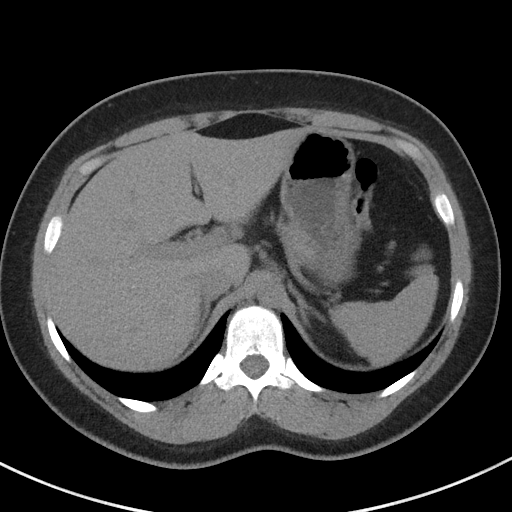
[im 85/98  soft-tissue]
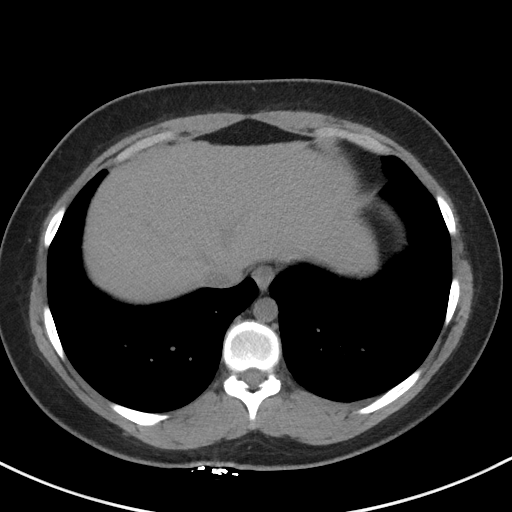
[im 93/98  soft-tissue]
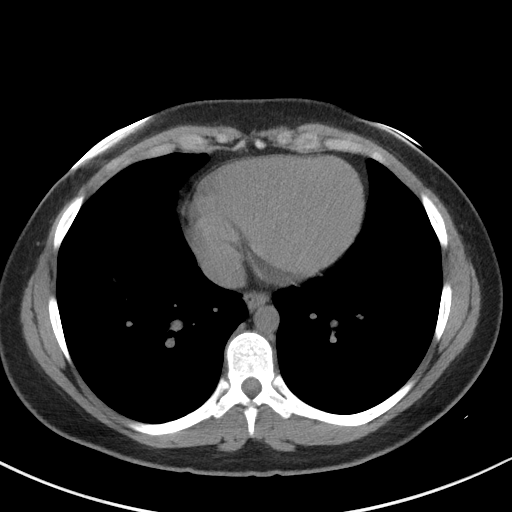

[Series 5: coronal · coronal · 0.67mm/px · 3 of 142 slices shown]
[im 48/142  soft-tissue]
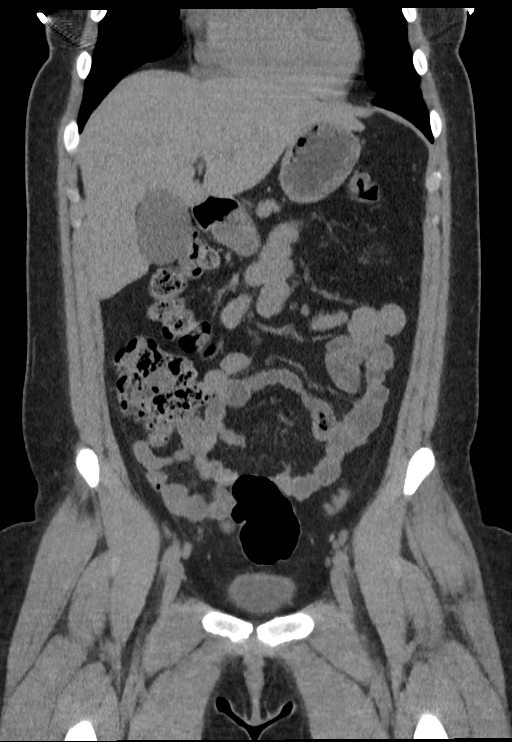
[im 63/142  soft-tissue]
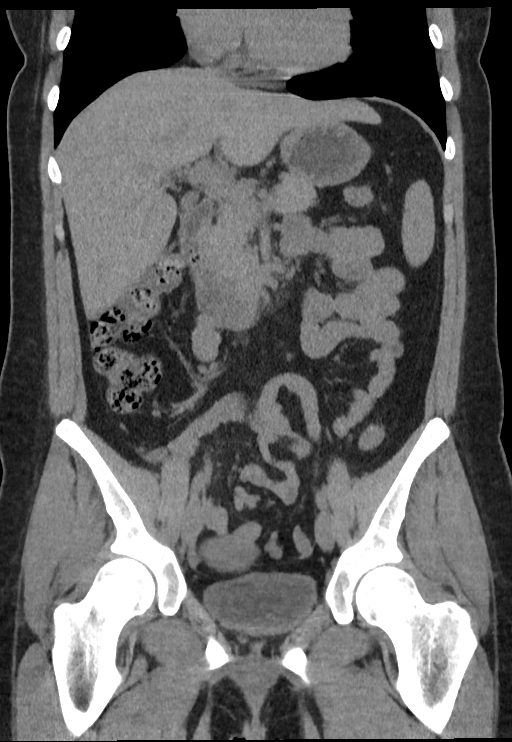
[im 79/142  soft-tissue]
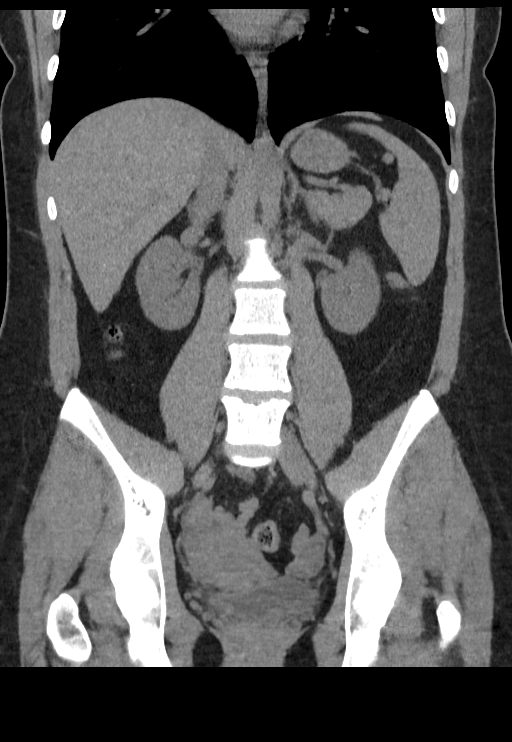

[16 of 46 positions shown; findings below may reference images not displayed]

FINDINGS: Lower chest: No acute abnormality.

Hepatobiliary: No focal liver abnormality is seen. No gallstones,
gallbladder wall thickening, or biliary dilatation.

Pancreas: Unremarkable. No pancreatic ductal dilatation or
surrounding inflammatory changes.

Spleen: Normal in size without focal abnormality.

Adrenals/Urinary Tract: Adrenal glands are unremarkable. Kidneys are
normal, without renal calculi, focal lesion, or hydronephrosis.
Bladder is unremarkable.

Stomach/Bowel: Stomach is within normal limits. Appendix appears
normal. No evidence of bowel wall thickening, distention, or
inflammatory changes.

Vascular/Lymphatic: No significant vascular findings are present. No
enlarged abdominal or pelvic lymph nodes.

Reproductive: Uterus and bilateral adnexa are unremarkable.

Other: No abdominal wall hernia or abnormality. No abdominopelvic
ascites.

Musculoskeletal: No acute or significant osseous findings.
IMPRESSION: No acute process identified.  Unremarkable CT of abdomen and pelvis.
